# Patient Record
Sex: Male | Born: 1990
Health system: Southern US, Community
[De-identification: ages and names within clinical notes are randomized; demographics above are authoritative.]

---
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
## 2013-12-05 ENCOUNTER — Ambulatory Visit (HOSPITAL_COMMUNITY)
Admission: RE | Admit: 2013-12-05 | Discharge: 2013-12-05 | Disposition: A | Discharge status: Home / Self Care | Payer: BC Managed Care – PPO | Source: Ambulatory Visit | Attending: Family Medicine | Admitting: Family Medicine

## 2013-12-05 ENCOUNTER — Other Ambulatory Visit (HOSPITAL_COMMUNITY): Payer: Self-pay | Admitting: Family Medicine

## 2013-12-05 DIAGNOSIS — S199XXA Unspecified injury of neck, initial encounter: Principal | ICD-10-CM

## 2013-12-05 DIAGNOSIS — M542 Cervicalgia: Secondary | ICD-10-CM

## 2013-12-05 DIAGNOSIS — M25519 Pain in unspecified shoulder: Secondary | ICD-10-CM

## 2013-12-05 DIAGNOSIS — S0993XA Unspecified injury of face, initial encounter: Secondary | ICD-10-CM | POA: Insufficient documentation

## 2013-12-05 DIAGNOSIS — X58XXXA Exposure to other specified factors, initial encounter: Secondary | ICD-10-CM | POA: Insufficient documentation

## 2016-05-14 ENCOUNTER — Encounter (HOSPITAL_COMMUNITY): Payer: Self-pay | Admitting: Emergency Medicine

## 2016-05-14 ENCOUNTER — Emergency Department (HOSPITAL_COMMUNITY)
Admission: EM | Admit: 2016-05-14 | Discharge: 2016-05-14 | Disposition: A | Discharge status: Home / Self Care | Payer: BLUE CROSS/BLUE SHIELD | Attending: Emergency Medicine | Admitting: Emergency Medicine

## 2016-05-14 ENCOUNTER — Emergency Department (HOSPITAL_COMMUNITY): Payer: BLUE CROSS/BLUE SHIELD

## 2016-05-14 DIAGNOSIS — M545 Low back pain: Secondary | ICD-10-CM | POA: Insufficient documentation

## 2016-05-14 DIAGNOSIS — M542 Cervicalgia: Secondary | ICD-10-CM | POA: Insufficient documentation

## 2016-05-14 DIAGNOSIS — Y999 Unspecified external cause status: Secondary | ICD-10-CM | POA: Diagnosis not present

## 2016-05-14 DIAGNOSIS — Z87891 Personal history of nicotine dependence: Secondary | ICD-10-CM | POA: Insufficient documentation

## 2016-05-14 DIAGNOSIS — Y92410 Unspecified street and highway as the place of occurrence of the external cause: Secondary | ICD-10-CM | POA: Insufficient documentation

## 2016-05-14 DIAGNOSIS — Y9389 Activity, other specified: Secondary | ICD-10-CM | POA: Insufficient documentation

## 2016-05-14 MED ORDER — NAPROXEN 500 MG PO TABS: ORAL_TABLET | ORAL | 0 refills | Status: AC
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
## 2016-05-14 NOTE — Discharge Instructions (Signed)
Follow-up with family doctor if any problems
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
## 2020-05-29 DIAGNOSIS — R110 Nausea: Secondary | ICD-10-CM | POA: Diagnosis not present

## 2020-05-29 DIAGNOSIS — E663 Overweight: Secondary | ICD-10-CM | POA: Diagnosis not present

## 2020-05-29 DIAGNOSIS — Z1389 Encounter for screening for other disorder: Secondary | ICD-10-CM | POA: Diagnosis not present

## 2020-05-29 DIAGNOSIS — Z6827 Body mass index (BMI) 27.0-27.9, adult: Secondary | ICD-10-CM | POA: Diagnosis not present

## 2020-05-29 DIAGNOSIS — B9789 Other viral agents as the cause of diseases classified elsewhere: Secondary | ICD-10-CM | POA: Diagnosis not present

## 2020-09-17 DIAGNOSIS — J019 Acute sinusitis, unspecified: Secondary | ICD-10-CM | POA: Diagnosis not present

## 2020-09-23 ENCOUNTER — Emergency Department (HOSPITAL_COMMUNITY): Payer: BC Managed Care – PPO

## 2020-09-23 ENCOUNTER — Other Ambulatory Visit: Payer: Self-pay

## 2020-09-23 ENCOUNTER — Encounter (HOSPITAL_COMMUNITY): Payer: Self-pay | Admitting: *Deleted

## 2020-09-23 ENCOUNTER — Emergency Department (HOSPITAL_COMMUNITY)
Admission: EM | Admit: 2020-09-23 | Discharge: 2020-09-23 | Disposition: A | Discharge status: Home / Self Care | Payer: BC Managed Care – PPO | Attending: Emergency Medicine | Admitting: Emergency Medicine

## 2020-09-23 DIAGNOSIS — R002 Palpitations: Secondary | ICD-10-CM | POA: Insufficient documentation

## 2020-09-23 DIAGNOSIS — R079 Chest pain, unspecified: Secondary | ICD-10-CM | POA: Diagnosis not present

## 2020-09-23 DIAGNOSIS — R0789 Other chest pain: Secondary | ICD-10-CM | POA: Diagnosis not present

## 2020-09-23 LAB — COMPREHENSIVE METABOLIC PANEL
ALT: 44 U/L (ref 0–44)
AST: 23 U/L (ref 15–41)
Albumin: 4.4 g/dL (ref 3.5–5.0)
Alkaline Phosphatase: 69 U/L (ref 38–126)
Anion gap: 8 (ref 5–15)
BUN: 13 mg/dL (ref 6–20)
CO2: 28 mmol/L (ref 22–32)
Calcium: 9.1 mg/dL (ref 8.9–10.3)
Chloride: 102 mmol/L (ref 98–111)
Creatinine, Ser: 0.96 mg/dL (ref 0.61–1.24)
GFR, Estimated: >60 mL/min (ref >60)
Glucose, Bld: 88 mg/dL (ref 70–99)
Potassium: 4.1 mmol/L (ref 3.5–5.1)
Sodium: 138 mmol/L (ref 135–145)
Total Bilirubin: 0.7 mg/dL (ref 0.3–1.2)
Total Protein: 6.8 g/dL (ref 6.5–8.1)

## 2020-09-23 LAB — CBC WITH DIFFERENTIAL/PLATELET
Abs Immature Granulocytes: 0.14 10*3/uL — ABNORMAL HIGH (ref 0.00–0.07)
Basophils Absolute: 0.1 10*3/uL (ref 0.0–0.1)
Basophils Relative: 1 %
Eosinophils Absolute: 0.1 10*3/uL (ref 0.0–0.5)
Eosinophils Relative: 1 %
HCT: 45.2 % (ref 39.0–52.0)
Hemoglobin: 15.7 g/dL (ref 13.0–17.0)
Immature Granulocytes: 2 %
Lymphocytes Relative: 34 %
Lymphs Abs: 2.5 10*3/uL (ref 0.7–4.0)
MCH: 30.5 pg (ref 26.0–34.0)
MCHC: 34.7 g/dL (ref 30.0–36.0)
MCV: 87.9 fL (ref 80.0–100.0)
Monocytes Absolute: 0.7 10*3/uL (ref 0.1–1.0)
Monocytes Relative: 9 %
Neutro Abs: 3.8 10*3/uL (ref 1.7–7.7)
Neutrophils Relative %: 53 %
Platelets: 186 10*3/uL (ref 150–400)
RBC: 5.14 MIL/uL (ref 4.22–5.81)
RDW: 12 % (ref 11.5–15.5)
WBC: 7.3 10*3/uL (ref 4.0–10.5)
nRBC: 0 % (ref 0.0–0.2)

## 2020-09-23 LAB — TROPONIN I (HIGH SENSITIVITY): Troponin I (High Sensitivity): 3 ng/L (ref <18)

## 2020-09-23 NOTE — ED Provider Notes (Signed)
[facility] EMERGENCY DEPARTMENT Provider Note   CSN: [id number] Arrival date & time: 09/23/20  1028     History Chief Complaint  Patient presents with  . Chest Pain    Curtis Nguyen is a 29 y.o. male.  Patient states that he can feel his heartbeat.  He is not having any chest pain no nausea no vomiting  The history is provided by the patient. No language interpreter was used.  Palpitations Palpitations quality:  Regular Onset quality:  Sudden Timing:  Constant Progression:  Worsening Chronicity:  New Context: anxiety   Relieved by:  Nothing Worsened by:  Nothing Ineffective treatments:  None tried Associated symptoms: no back pain, no chest pain and no cough        History reviewed. No pertinent past medical history.  There are no problems to display for this patient.   History reviewed. No pertinent surgical history.     No family history on file.  Social History   Tobacco Use  . Smoking status: Former Smoker    Types: E-cigarettes    Quit date: 05/14/2013    Years since quitting: 7.3  . Smokeless tobacco: Never Used  Vaping Use  . Vaping Use: Every day  Substance Use Topics  . Alcohol use: Not Currently  . Drug use: No    Home Medications Prior to Admission medications   Medication Sig Start Date End Date Taking? Authorizing Provider  naproxen (NAPROSYN) 500 MG tablet Take one every 12 hours if needed for pain 05/14/16   Bethann Berkshire, MD    Allergies    Patient has no known allergies.  Review of Systems   Review of Systems  Constitutional: Negative for appetite change and fatigue.  HENT: Negative for congestion, ear discharge and sinus pressure.   Eyes: Negative for discharge.  Respiratory: Negative for cough.   Cardiovascular: Positive for palpitations. Negative for chest pain.  Gastrointestinal: Negative for abdominal pain and diarrhea.  Genitourinary: Negative for frequency and hematuria.  Musculoskeletal: Negative for back pain.   Skin: Negative for rash.  Neurological: Negative for seizures and headaches.  Psychiatric/Behavioral: Negative for hallucinations.    Physical Exam Updated Vital Signs BP 129/85   Pulse (!) 52   Temp 98.2 F (36.8 C) (Oral)   Resp 15   Ht 5\' 6"  (1.676 m)   Wt 83.9 kg   SpO2 96%   BMI 29.86 kg/m   Physical Exam Vitals and nursing note reviewed.  Constitutional:      Appearance: He is well-developed.  HENT:     Head: Normocephalic.     Nose: Nose normal.  Eyes:     General: No scleral icterus.    Conjunctiva/sclera: Conjunctivae normal.  Neck:     Thyroid: No thyromegaly.  Cardiovascular:     Rate and Rhythm: Normal rate and regular rhythm.     Heart sounds: No murmur heard.  No friction rub. No gallop.   Pulmonary:     Breath sounds: No stridor. No wheezing or rales.  Chest:     Chest wall: No tenderness.  Abdominal:     General: There is no distension.     Tenderness: There is no abdominal tenderness. There is no rebound.  Musculoskeletal:        General: Normal range of motion.     Cervical back: Neck supple.  Lymphadenopathy:     Cervical: No cervical adenopathy.  Skin:    Findings: No erythema or rash.  Neurological:  Mental Status: He is alert and oriented to person, place, and time.     Motor: No abnormal muscle tone.     Coordination: Coordination normal.  Psychiatric:        Behavior: Behavior normal.     ED Results / Procedures / Treatments   Labs (all labs ordered are listed, but only abnormal results are displayed) Labs Reviewed  CBC WITH DIFFERENTIAL/PLATELET - Abnormal; Notable for the following components:      Result Value   Abs Immature Granulocytes 0.14 (*)    All other components within normal limits  COMPREHENSIVE METABOLIC PANEL  TROPONIN I (HIGH SENSITIVITY)  TROPONIN I (HIGH SENSITIVITY)    EKG None  Radiology DG Chest Port 1 View  Result Date: 09/23/2020 CLINICAL DATA:  Pain. EXAM: PORTABLE CHEST 1 VIEW COMPARISON:   None. FINDINGS: The heart size and mediastinal contours are within normal limits. Both lungs are clear. The visualized skeletal structures are unremarkable. IMPRESSION: No active disease. Electronically Signed   By: Gerome Sam III M.D   On: 09/23/2020 12:57    Procedures Procedures (including critical care time)  Medications Ordered in ED Medications - No data to display  ED Course  I have reviewed the triage vital signs and the nursing notes.  Pertinent labs & imaging results that were available during my care of the patient were reviewed by me and considered in my medical decision making (see chart for details).    MDM Rules/Calculators/A&P                          Labs EKG chest x-ray unremarkable.  Patient with palpitations.  And feeling heartbeats.  His heartbeat is not regular according to EKG.  He will eliminate caffeine and follow-up with PCP Final Clinical Impression(s) / ED Diagnoses Final diagnoses:  Atypical chest pain    Rx / DC Orders ED Discharge Orders    None       Bethann Berkshire, MD 09/24/20 [phone]

## 2020-09-23 NOTE — ED Triage Notes (Signed)
Pt c/o pain in his legs since Sunday, achiness and numbness to bottom of bilateral feet. Pt also c/o  feeling heart pounding when lying down that started last night. Pt reports he went to the chiropractor and the leg pain and numbness to feet went away after he adjusted him. Chiropractor told him he had "knots" in his back. Pt reports the leg pain and foot numbness came back last night when the started to feel his heart pounding. Pt reports he feels it more when lying down. Denies SOB.

## 2020-09-23 NOTE — ED Notes (Signed)
Call to lab with diet ordered   Pt and family distressed about length of time here  Troponin explained   awaiting second draw

## 2020-09-23 NOTE — ED Notes (Signed)
Patient denies pain and is resting comfortably. Pt states he can feel his heart beating in his chest, not faster or slower just beating. C/O some feet tingling and numb at times. Pt went to chiropractor Fri for adjustment.

## 2020-09-23 NOTE — ED Notes (Signed)
Call to lab  Late 2nd troponin

## 2020-09-23 NOTE — Discharge Instructions (Addendum)
Try not to consume caffeine anymore and follow-up with a family doctor in the next couple weeks for recheck

## 2020-09-27 DIAGNOSIS — M5459 Other low back pain: Secondary | ICD-10-CM | POA: Diagnosis not present

## 2020-10-10 DIAGNOSIS — M5459 Other low back pain: Secondary | ICD-10-CM | POA: Diagnosis not present

## 2021-03-13 ENCOUNTER — Ambulatory Visit
Admission: EM | Admit: 2021-03-13 | Discharge: 2021-03-13 | Disposition: A | Discharge status: Home / Self Care | Payer: BC Managed Care – PPO

## 2021-03-13 ENCOUNTER — Other Ambulatory Visit: Payer: Self-pay

## 2021-03-13 ENCOUNTER — Encounter: Payer: Self-pay | Admitting: Emergency Medicine

## 2021-03-13 DIAGNOSIS — R0789 Other chest pain: Secondary | ICD-10-CM

## 2021-03-13 DIAGNOSIS — J209 Acute bronchitis, unspecified: Secondary | ICD-10-CM

## 2021-03-13 DIAGNOSIS — Z8709 Personal history of other diseases of the respiratory system: Secondary | ICD-10-CM

## 2021-03-13 MED ORDER — CYCLOBENZAPRINE HCL 10 MG PO TABS: 10.0000 mg | ORAL_TABLET | Freq: Two times a day (BID) | ORAL | 0 refills | Status: DC | PRN

## 2021-03-13 MED ORDER — ALBUTEROL SULFATE HFA 108 (90 BASE) MCG/ACT IN AERS
2.0000 | INHALATION_SPRAY | Freq: Once | RESPIRATORY_TRACT | Status: AC
  Administered 2021-03-13: 2 via RESPIRATORY_TRACT

## 2021-03-13 NOTE — Discharge Instructions (Addendum)
I have sent in an albuterol inhaler for you to use 2 puffs every 4-6 hours as needed for cough, shortness of breath, wheezing.  I have sent in flexeril for you to take twice a day as needed for muscle spasms. This medication can make you sleepy. Do not drive or operate heavy machinery with this medication.  Follow up with this office or with primary care if symptoms are persisting.  Follow up in the ER for high fever, trouble swallowing, trouble breathing, other concerning symptoms.

## 2021-03-13 NOTE — ED Provider Notes (Addendum)
[facility]    CSN: [id number] Arrival date & time: 03/13/21  5176      History   Chief Complaint No chief complaint on file.   HPI Curtis Nguyen is a 30 y.o. male.   Reports chest pain, pressure and tightness with activity.  Reports that his chest is tender to touch over his sternum.  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  Also reports that as a kid he had asthma.  Has not used an inhaler in years.  Reports that he had a cold last week, thinks that he may also be fighting some bronchitis at this time.  Has not attempted other treatment at home besides rest.  Denies previous symptoms.  Denies personal cardiac history including hypertension, CVA, MI.  Reports that his dad has recently had a blood clot and has high blood pressure.  Denies SOB, diaphoresis, radiating pain, jaw pain, arm pain, abdominal pain, nausea, vomiting, diarrhea, rash, other symptoms.  The history is provided by the patient.    History reviewed. No pertinent past medical history.  There are no problems to display for this patient.   History reviewed. No pertinent surgical history.     Home Medications    Prior to Admission medications   Medication Sig Start Date End Date Taking? Authorizing Provider  cyclobenzaprine (FLEXERIL) 10 MG tablet Take 1 tablet (10 mg total) by mouth 2 (two) times daily as needed for muscle spasms. 03/13/21  Yes Moshe Cipro, NP  levocetirizine (XYZAL) 5 MG tablet Take 5 mg by mouth every evening.   Yes [provider]  naproxen (NAPROSYN) 500 MG tablet Take one every 12 hours if needed for pain 05/14/16   Bethann Berkshire, MD    Family History Family History  Problem Relation Age of Onset  . Healthy Mother   . Pulmonary embolism Father   . Diabetes Father     Social History Social History   Tobacco Use  . Smoking status: Former Smoker    Types: E-cigarettes    Quit date: 05/14/2013    Years  since quitting: 7.8  . Smokeless tobacco: Never Used  Vaping Use  . Vaping Use: Every day  Substance Use Topics  . Alcohol use: Not Currently  . Drug use: No     Allergies   Patient has no known allergies.   Review of Systems Review of Systems   Physical Exam Triage Vital Signs ED Triage Vitals  Enc Vitals Group     BP 03/13/21 0854 134/82     Pulse Rate 03/13/21 0854 75     Resp 03/13/21 0854 16     Temp 03/13/21 0854 97.8 F (36.6 C)     Temp Source 03/13/21 0854 Oral     SpO2 03/13/21 0854 98 %     Weight --      Height --      Head Circumference --      Peak Flow --      Pain Score 03/13/21 0856 6     Pain Loc --      Pain Edu? --      Excl. in GC? --    No data found.  Updated Vital Signs BP 134/82 (BP Location: Right Arm)   Pulse 75   Temp 97.8 F (36.6 C) (Oral)   Resp 16   SpO2 98%   Visual Acuity Right Eye Distance:   Left Eye  Distance:   Bilateral Distance:    Right Eye Near:   Left Eye Near:    Bilateral Near:     Physical Exam Vitals and nursing note reviewed.  Constitutional:      General: He is not in acute distress.    Appearance: Normal appearance. He is well-developed and normal weight. He is not ill-appearing.  HENT:     Head: Normocephalic and atraumatic.     Nose: Nose normal.     Mouth/Throat:     Mouth: Mucous membranes are moist.     Pharynx: Oropharynx is clear.  Eyes:     Extraocular Movements: Extraocular movements intact.     Conjunctiva/sclera: Conjunctivae normal.     Pupils: Pupils are equal, round, and reactive to light.  Cardiovascular:     Rate and Rhythm: Normal rate and regular rhythm.     Heart sounds: Normal heart sounds. No murmur heard.   Pulmonary:     Effort: Pulmonary effort is normal. No respiratory distress.     Breath sounds: No stridor. Wheezing (Mild to bilateral bases) present. No rhonchi or rales.  Chest:     Chest wall: Tenderness (Chest wall) present.  Abdominal:     General: Bowel  sounds are normal. There is no distension.     Palpations: Abdomen is soft. There is no mass.     Tenderness: There is no abdominal tenderness. There is no right CVA tenderness, left CVA tenderness, guarding or rebound.     Hernia: No hernia is present.  Musculoskeletal:        General: Normal range of motion.     Cervical back: Normal range of motion and neck supple.  Lymphadenopathy:     Cervical: No cervical adenopathy.  Skin:    General: Skin is warm and dry.     Capillary Refill: Capillary refill takes less than 2 seconds.  Neurological:     General: No focal deficit present.     Mental Status: He is alert and oriented to person, place, and time.  Psychiatric:        Mood and Affect: Mood normal.        Behavior: Behavior normal.        Thought Content: Thought content normal.      UC Treatments / Results  Labs (all labs ordered are listed, but only abnormal results are displayed) Labs Reviewed - No data to display  EKG   Radiology No results found.  Procedures Procedures (including critical care time)  Medications Ordered in UC Medications  albuterol (VENTOLIN HFA) 108 (90 Base) MCG/ACT inhaler 2 puff (2 puffs Inhalation Given 03/13/21 0958)    Initial Impression / Assessment and Plan / UC Course  I have reviewed the triage vital signs and the nursing notes.  Pertinent labs & imaging results that were available during my care of the patient were reviewed by me and considered in my medical decision making (see chart for details).    Chest wall pain Hx reactive airway disease Acute bronchitis  EKG shows NSR Albuterol inhaler given in office Prescribed cyclobenzaprine Sedation precautions given Follow up with this office or with primary care if symptoms are persisting. Follow up in the ER for high fever, trouble swallowing, trouble breathing, other concerning symptoms.   Final Clinical Impressions(s) / UC Diagnoses   Final diagnoses:  Chest wall pain   History of reactive airway disease  Acute bronchitis, unspecified organism     Discharge Instructions     I  have sent in an albuterol inhaler for you to use 2 puffs every 4-6 hours as needed for cough, shortness of breath, wheezing.  I have sent in flexeril for you to take twice a day as needed for muscle spasms. This medication can make you sleepy. Do not drive or operate heavy machinery with this medication.  Follow up with this office or with primary care if symptoms are persisting.  Follow up in the ER for high fever, trouble swallowing, trouble breathing, other concerning symptoms.     ED Prescriptions    Medication Sig Dispense Auth. Provider   cyclobenzaprine (FLEXERIL) 10 MG tablet Take 1 tablet (10 mg total) by mouth 2 (two) times daily as needed for muscle spasms. 20 tablet Moshe Cipro, NP     PDMP not reviewed this encounter.   Moshe Cipro, NP 03/13/21 1103    Moshe Cipro, NP 03/13/21 1104

## 2021-03-13 NOTE — ED Triage Notes (Signed)
Fatigue 3 to 4 days ago.  States he went back to work and started having chest tightness.  Now the tightness comes and goes.  Worse with activity.  Aching feeling in lower legs and feet.

## 2021-07-22 IMAGING — DX DG CHEST 1V PORT
1 series · 1 of 1 positions shown · non-contrast
Comparison: None.

CLINICAL DATA: Pain.

EXAM:
PORTABLE CHEST 1 VIEW

[chest ap]
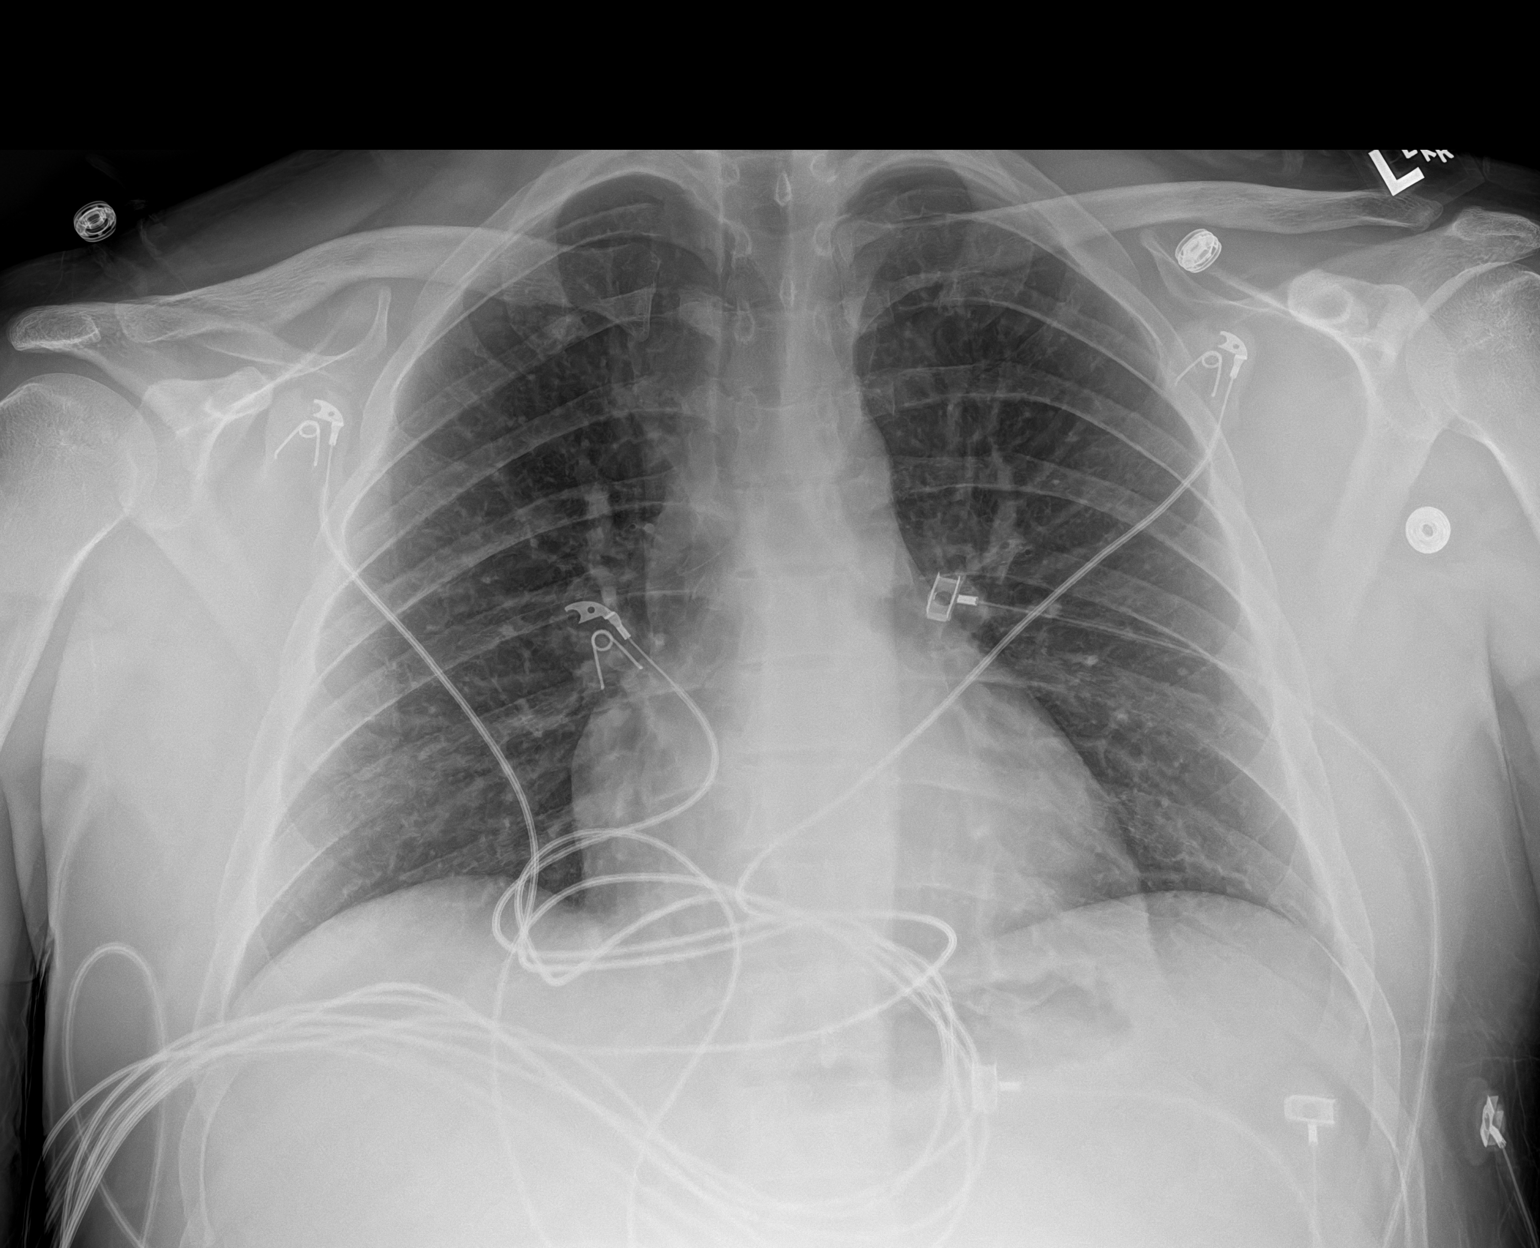

[1 of 1 positions shown; findings below may reference images not displayed]

FINDINGS: The heart size and mediastinal contours are within normal limits.
Both lungs are clear. The visualized skeletal structures are
unremarkable.
IMPRESSION: No active disease.

## 2022-08-29 ENCOUNTER — Ambulatory Visit
Admission: EM | Admit: 2022-08-29 | Discharge: 2022-08-29 | Disposition: A | Discharge status: Home / Self Care | Payer: BC Managed Care – PPO | Attending: Nurse Practitioner | Admitting: Nurse Practitioner

## 2022-08-29 DIAGNOSIS — J069 Acute upper respiratory infection, unspecified: Secondary | ICD-10-CM | POA: Diagnosis not present

## 2022-08-29 DIAGNOSIS — Z1152 Encounter for screening for COVID-19: Secondary | ICD-10-CM | POA: Insufficient documentation

## 2022-08-29 MED ORDER — BENZONATATE 100 MG PO CAPS: 100.0000 mg | ORAL_CAPSULE | Freq: Three times a day (TID) | ORAL | 0 refills | Status: DC | PRN

## 2022-08-29 MED ORDER — PROMETHAZINE-DM 6.25-15 MG/5ML PO SYRP: 5.0000 mL | ORAL_SOLUTION | Freq: Every evening | ORAL | 0 refills | Status: DC | PRN

## 2022-08-29 NOTE — ED Provider Notes (Signed)
[facility]    CSN: [id number] Arrival date & time: 08/29/22  1139      History   Chief Complaint No chief complaint on file.   HPI Curtis Nguyen is a 31 y.o. male.   Patient presents for 2-day history of dry cough, chest and nasal congestion, runny nose, postnasal drainage, sneezing, sore throat, sinus pressure and headache, decreased appetite, and fatigue.  He denies fever, body aches/chills, shortness of breath or chest pain, ear pain or pressure, abdominal pain, nausea/vomiting, diarrhea, loss of taste or smell, new rash.  No known sick contacts.  Has been taking 12-hour sinus medication without help.  No known history of chronic lung disease, however he had asthma as a child.  Patient reports he does vape.    History reviewed. No pertinent past medical history.  There are no problems to display for this patient.   History reviewed. No pertinent surgical history.     Home Medications    Prior to Admission medications   Medication Sig Start Date End Date Taking? Authorizing Provider  benzonatate (TESSALON) 100 MG capsule Take 1 capsule (100 mg total) by mouth 3 (three) times daily as needed for cough. Do not take with alcohol or while driving or operating heavy machinery.  May cause drowsiness. 08/29/22  Yes Valentino Nose, NP  promethazine-dextromethorphan (PROMETHAZINE-DM) 6.25-15 MG/5ML syrup Take 5 mLs by mouth at bedtime as needed for cough. Do not take with alcohol or while driving or operating heavy machinery.  May cause drowsiness. 08/29/22  Yes Valentino Nose, NP  cyclobenzaprine (FLEXERIL) 10 MG tablet Take 1 tablet (10 mg total) by mouth 2 (two) times daily as needed for muscle spasms. 03/13/21   Moshe Cipro, NP  levocetirizine (XYZAL) 5 MG tablet Take 5 mg by mouth every evening.    [provider]  naproxen (NAPROSYN) 500 MG tablet Take one every 12 hours if needed for pain 05/14/16   Bethann Berkshire, MD    Family  History Family History  Problem Relation Age of Onset   Healthy Mother    Pulmonary embolism Father    Diabetes Father     Social History Social History   Tobacco Use   Smoking status: Former    Types: E-cigarettes    Quit date: 05/14/2013    Years since quitting: 9.2   Smokeless tobacco: Never  Vaping Use   Vaping Use: Every day  Substance Use Topics   Alcohol use: Not Currently   Drug use: No     Allergies   Patient has no known allergies.   Review of Systems Review of Systems Per HPI  Physical Exam Triage Vital Signs ED Triage Vitals  Enc Vitals Group     BP 08/29/22 1148 116/80     Pulse Rate 08/29/22 1148 100     Resp 08/29/22 1148 16     Temp 08/29/22 1148 98.3 F (36.8 C)     Temp Source 08/29/22 1148 Oral     SpO2 08/29/22 1148 96 %     Weight --      Height --      Head Circumference --      Peak Flow --      Pain Score 08/29/22 1145 7     Pain Loc --      Pain Edu? --      Excl. in GC? --    No data found.  Updated Vital Signs BP 116/80 (BP Location: Right Arm)  Pulse 100   Temp 98.3 F (36.8 C) (Oral)   Resp 16   SpO2 96%   Visual Acuity Right Eye Distance:   Left Eye Distance:   Bilateral Distance:    Right Eye Near:   Left Eye Near:    Bilateral Near:     Physical Exam Vitals and nursing note reviewed.  Constitutional:      General: He is not in acute distress.    Appearance: Normal appearance. He is not ill-appearing or toxic-appearing.  HENT:     Head: Normocephalic and atraumatic.     Right Ear: Ear canal and external ear normal. Tympanic membrane is scarred.     Left Ear: Tympanic membrane, ear canal and external ear normal.     Nose: Congestion present. No rhinorrhea.     Right Sinus: No maxillary sinus tenderness or frontal sinus tenderness.     Left Sinus: No maxillary sinus tenderness or frontal sinus tenderness.     Mouth/Throat:     Mouth: Mucous membranes are moist.     Pharynx: Oropharynx is clear.  Posterior oropharyngeal erythema present. No oropharyngeal exudate.     Tonsils: No tonsillar exudate. 0 on the right. 0 on the left.  Eyes:     General: No scleral icterus.    Extraocular Movements: Extraocular movements intact.  Cardiovascular:     Rate and Rhythm: Normal rate and regular rhythm.  Pulmonary:     Effort: Pulmonary effort is normal. No respiratory distress.     Breath sounds: Normal breath sounds. No wheezing, rhonchi or rales.  Abdominal:     General: Abdomen is flat. Bowel sounds are normal. There is no distension.     Palpations: Abdomen is soft.     Tenderness: There is no abdominal tenderness. There is no guarding.  Musculoskeletal:     Cervical back: Normal range of motion and neck supple.  Lymphadenopathy:     Cervical: No cervical adenopathy.  Skin:    General: Skin is warm and dry.     Coloration: Skin is not jaundiced or pale.     Findings: No erythema or rash.  Neurological:     Mental Status: He is alert and oriented to person, place, and time.  Psychiatric:        Behavior: Behavior is cooperative.      UC Treatments / Results  Labs (all labs ordered are listed, but only abnormal results are displayed) Labs Reviewed  SARS CORONAVIRUS 2 (TAT 6-24 HRS)    EKG   Radiology No results found.  Procedures Procedures (including critical care time)  Medications Ordered in UC Medications - No data to display  Initial Impression / Assessment and Plan / UC Course  I have reviewed the triage vital signs and the nursing notes.  Pertinent labs & imaging results that were available during my care of the patient were reviewed by me and considered in my medical decision making (see chart for details).   Patient is well-appearing, normotensive, afebrile, not tachycardic, not tachypneic, oxygenating well on room air.    Encounter for screening for COVID-19 Viral URI with cough Suspect viral etiology COVID-19 testing obtained Supportive care  discussed Start cough suppressant Note given for work ER and return precautions discussed  The patient was given the opportunity to ask questions.  All questions answered to their satisfaction.  The patient is in agreement to this plan.    Final Clinical Impressions(s) / UC Diagnoses   Final diagnoses:  Encounter for  screening for COVID-19  Viral URI with cough     Discharge Instructions      You have a viral upper respiratory infection.  Symptoms should improve over the next week to 10 days.  If you develop chest pain or shortness of breath, go to the emergency room.  We have tested you today for COVID-19.  You will see the results in Mychart and we will call you with positive results.    Please stay home and isolate until you are aware of the results.    Some things that can make you feel better are: - Increased rest - Increasing fluid with water/sugar free electrolytes - Acetaminophen and ibuprofen as needed for fever/pain - Salt water gargling, chloraseptic spray and throat lozenges - OTC guaifenesin (Mucinex) 600 mg twice daily - Saline sinus flushes or a neti pot - Humidifying the air -Tessalon Perles during the day as needed for dry cough and cough syrup at nighttime as needed for dry cough      ED Prescriptions     Medication Sig Dispense Auth. Provider   benzonatate (TESSALON) 100 MG capsule Take 1 capsule (100 mg total) by mouth 3 (three) times daily as needed for cough. Do not take with alcohol or while driving or operating heavy machinery.  May cause drowsiness. 21 capsule Cathlean Marseilles A, NP   promethazine-dextromethorphan (PROMETHAZINE-DM) 6.25-15 MG/5ML syrup Take 5 mLs by mouth at bedtime as needed for cough. Do not take with alcohol or while driving or operating heavy machinery.  May cause drowsiness. 118 mL Valentino Nose, NP      PDMP not reviewed this encounter.   Valentino Nose, NP 08/29/22 1216

## 2022-08-29 NOTE — Discharge Instructions (Addendum)
You have a viral upper respiratory infection.  Symptoms should improve over the next week to 10 days.  If you develop chest pain or shortness of breath, go to the emergency room.  We have tested you today for COVID-19.  You will see the results in Mychart and we will call you with positive results.  Please stay home and isolate until you are aware of the results.    Some things that can make you feel better are: - Increased rest - Increasing fluid with water/sugar free electrolytes - Acetaminophen and ibuprofen as needed for fever/pain - Salt water gargling, chloraseptic spray and throat lozenges - OTC guaifenesin (Mucinex) 600 mg twice daily - Saline sinus flushes or a neti pot - Humidifying the air -Tessalon Perles during the day as needed for dry cough and cough syrup at nighttime as needed for dry cough 

## 2022-08-29 NOTE — ED Triage Notes (Signed)
Pt reports cough, sinus pressure and headache x 2 days. OTC cough meds gives no relief.

## 2022-08-30 LAB — SARS CORONAVIRUS 2 (TAT 6-24 HRS): SARS Coronavirus 2: NEGATIVE

## 2023-02-27 ENCOUNTER — Encounter: Payer: Self-pay | Admitting: Emergency Medicine

## 2023-02-27 ENCOUNTER — Other Ambulatory Visit: Payer: Self-pay

## 2023-02-27 ENCOUNTER — Ambulatory Visit
Admission: EM | Admit: 2023-02-27 | Discharge: 2023-02-27 | Disposition: A | Discharge status: Home / Self Care | Payer: BC Managed Care – PPO | Attending: Internal Medicine | Admitting: Internal Medicine

## 2023-02-27 DIAGNOSIS — J3089 Other allergic rhinitis: Secondary | ICD-10-CM | POA: Diagnosis not present

## 2023-02-27 DIAGNOSIS — R519 Headache, unspecified: Secondary | ICD-10-CM | POA: Diagnosis not present

## 2023-02-27 MED ORDER — METHYLPREDNISOLONE SODIUM SUCC 125 MG IJ SOLR
60.0000 mg | Freq: Once | INTRAMUSCULAR | Status: AC
  Administered 2023-02-27: 60 mg via INTRAMUSCULAR

## 2023-02-27 NOTE — ED Triage Notes (Signed)
Pt reports nasal congestion, headache since Wednesday. Pt reports has taken otc medication with no change in headache.

## 2023-02-27 NOTE — ED Provider Notes (Signed)
[facility]    CSN: [id number] Arrival date & time: 02/27/23  1317      History   Chief Complaint Chief Complaint  Patient presents with   Nasal Congestion    HPI Curtis Nguyen is a 32 y.o. male who presents with onset of nose congestion and HA x 2 days. He took OTC cough and congestion med which helped the nose congestion a little.  Has a HA on his forehead and took Ibuprofen for a couple of days when this started initially, but has not helped. The cough has resolved. Admits post nasal drainage. Has been fatigued. His appetite is normal. Denies fever, chills, sweats. Has not had nasal surgeries in the past.  Also been taking his allergy med qd, and doing saline nose rinses which also believes has been helping with the nose congestion.     History reviewed. No pertinent past medical history.  There are no problems to display for this patient.   History reviewed. No pertinent surgical history.     Home Medications    Prior to Admission medications   Medication Sig Start Date End Date Taking? Authorizing Provider  levocetirizine (XYZAL) 5 MG tablet Take 5 mg by mouth every evening.    [provider]  naproxen (NAPROSYN) 500 MG tablet Take one every 12 hours if needed for pain 05/14/16   Bethann Berkshire, MD    Family History Family History  Problem Relation Age of Onset   Healthy Mother    Pulmonary embolism Father    Diabetes Father     Social History Social History   Tobacco Use   Smoking status: Former    Types: E-cigarettes    Quit date: 05/14/2013    Years since quitting: 9.7   Smokeless tobacco: Never  Vaping Use   Vaping Use: Former  Substance Use Topics   Alcohol use: Not Currently   Drug use: No     Allergies   Patient has no known allergies.   Review of Systems Review of Systems As noted in HPI  Physical Exam Triage Vital Signs ED Triage Vitals  Enc Vitals Group     BP 02/27/23 1451 127/81     Pulse Rate  02/27/23 1451 81     Resp 02/27/23 1451 20     Temp 02/27/23 1451 98.4 F (36.9 C)     Temp Source 02/27/23 1451 Oral     SpO2 02/27/23 1451 95 %     Weight --      Height --      Head Circumference --      Peak Flow --      Pain Score 02/27/23 1450 7     Pain Loc --      Pain Edu? --      Excl. in GC? --    No data found.  Updated Vital Signs BP 127/81 (BP Location: Right Arm)   Pulse 81   Temp 98.4 F (36.9 C) (Oral)   Resp 20   SpO2 95%   Visual Acuity Right Eye Distance:   Left Eye Distance:   Bilateral Distance:    Right Eye Near:   Left Eye Near:    Bilateral Near:     Physical Exam Vitals and nursing note reviewed.  Constitutional:      General: He is not in acute distress.    Appearance: He is not toxic-appearing.  HENT:     Right Ear: Tympanic membrane, ear canal and external  ear normal.     Left Ear: Tympanic membrane, ear canal and external ear normal.     Nose: Nose normal.     Comments: Has sinus tenderness on maxillary sinuses L>R. All his sinuses have good transilumination Eyes:     General: No scleral icterus.    Extraocular Movements: Extraocular movements intact.     Conjunctiva/sclera: Conjunctivae normal.     Pupils: Pupils are equal, round, and reactive to light.  Cardiovascular:     Rate and Rhythm: Regular rhythm.     Heart sounds: No murmur heard. Pulmonary:     Effort: Pulmonary effort is normal.     Breath sounds: Normal breath sounds.  Musculoskeletal:        General: Normal range of motion.     Cervical back: Neck supple.  Lymphadenopathy:     Cervical: No cervical adenopathy.  Skin:    General: Skin is warm and dry.  Neurological:     Mental Status: He is alert and oriented to person, place, and time.     Gait: Gait normal.     Comments: Speech is normal. No face asymmetry noted  Psychiatric:        Mood and Affect: Mood normal.        Behavior: Behavior normal.        Thought Content: Thought content normal.         Judgment: Judgment normal.      UC Treatments / Results  Labs (all labs ordered are listed, but only abnormal results are displayed) Labs Reviewed - No data to display  EKG   Radiology No results found.  Procedures Procedures (including critical care time)  Medications Ordered in UC Medications  methylPREDNISolone sodium succinate (SOLU-MEDROL) 125 mg/2 mL injection 60 mg (60 mg Intramuscular Given 02/27/23 1529)    Initial Impression / Assessment and Plan / UC Course  I have reviewed the triage vital signs and the nursing notes.  Sinus HA Allergic rhinitis  I believe he has sinus inflammation from a cold, so I offered him Medrol which ENT's have used, but he requested a shot instead. So he was given Solumedrol 60 mg IM as noted.  Advised to continue the saline rinses for another 3 days and his allergy med.      Final Clinical Impressions(s) / UC Diagnoses   Final diagnoses:  Seasonal allergic rhinitis due to other allergic trigger  Sinus headache   Discharge Instructions   None    ED Prescriptions   None    PDMP not reviewed this encounter.   Garey Ham, PA-C 02/27/23 1534
